# Patient Record
Sex: Male | Born: 1985 | Race: White | Hispanic: No | Marital: Single | Smoking: Never smoker
Health system: Southern US, Community
[De-identification: ages and names within clinical notes are randomized; demographics above are authoritative.]

## PROBLEM LIST (undated history)

## (undated) DIAGNOSIS — F32A Depression, unspecified: Secondary | ICD-10-CM

## (undated) DIAGNOSIS — F419 Anxiety disorder, unspecified: Secondary | ICD-10-CM

## (undated) DIAGNOSIS — F329 Major depressive disorder, single episode, unspecified: Secondary | ICD-10-CM

## (undated) HISTORY — PX: TONSILLECTOMY AND ADENOIDECTOMY: SHX28

## (undated) HISTORY — PX: TYMPANOSTOMY TUBE PLACEMENT: SHX32

## (undated) HISTORY — PX: WISDOM TOOTH EXTRACTION: SHX21

## (undated) HISTORY — PX: TONSILLECTOMY: SUR1361

---

## 2010-05-17 ENCOUNTER — Encounter: Admission: RE | Admit: 2010-05-17 | Discharge: 2010-05-17 | Payer: Self-pay | Admitting: Occupational Medicine

## 2012-07-27 ENCOUNTER — Emergency Department (HOSPITAL_COMMUNITY)
Admission: EM | Admit: 2012-07-27 | Discharge: 2012-07-27 | Disposition: A | Discharge status: Home / Self Care | Payer: Worker's Compensation | Attending: Emergency Medicine | Admitting: Emergency Medicine

## 2012-07-27 ENCOUNTER — Encounter (HOSPITAL_COMMUNITY): Payer: Self-pay | Admitting: *Deleted

## 2012-07-27 ENCOUNTER — Emergency Department (HOSPITAL_COMMUNITY): Payer: Worker's Compensation

## 2012-07-27 DIAGNOSIS — S0993XA Unspecified injury of face, initial encounter: Secondary | ICD-10-CM | POA: Insufficient documentation

## 2012-07-27 DIAGNOSIS — Y92410 Unspecified street and highway as the place of occurrence of the external cause: Secondary | ICD-10-CM | POA: Insufficient documentation

## 2012-07-27 DIAGNOSIS — S199XXA Unspecified injury of neck, initial encounter: Secondary | ICD-10-CM

## 2012-07-27 MED ORDER — IBUPROFEN 400 MG PO TABS
400.0000 mg | ORAL_TABLET | Freq: Once | ORAL | Status: AC
  Administered 2012-07-27: 400 mg via ORAL
  Filled 2012-07-27: qty 1
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
## 2012-07-27 NOTE — ED Notes (Signed)
Pt ambulating independently w/ steady gait on d/c in no acute distress, A&Ox4. D/c instructions reviewed w/ pt - pt denies any further questions or concerns at present.  

## 2013-04-18 ENCOUNTER — Other Ambulatory Visit: Payer: Self-pay | Admitting: Family Medicine

## 2013-04-18 DIAGNOSIS — R1909 Other intra-abdominal and pelvic swelling, mass and lump: Secondary | ICD-10-CM

## 2013-04-24 ENCOUNTER — Ambulatory Visit
Admission: RE | Admit: 2013-04-24 | Discharge: 2013-04-24 | Disposition: A | Discharge status: Home / Self Care | Payer: 59 | Source: Ambulatory Visit | Attending: Family Medicine | Admitting: Family Medicine

## 2013-04-24 DIAGNOSIS — R1909 Other intra-abdominal and pelvic swelling, mass and lump: Secondary | ICD-10-CM

## 2014-03-28 ENCOUNTER — Encounter (INDEPENDENT_AMBULATORY_CARE_PROVIDER_SITE_OTHER): Payer: Self-pay | Admitting: General Surgery

## 2014-03-28 ENCOUNTER — Ambulatory Visit (INDEPENDENT_AMBULATORY_CARE_PROVIDER_SITE_OTHER): Payer: 59 | Admitting: General Surgery

## 2014-03-28 VITALS — BP 118/70 | HR 81 | Temp 98.6°F | Resp 16 | Ht 73.0 in | Wt 199.6 lb

## 2014-03-28 DIAGNOSIS — K4090 Unilateral inguinal hernia, without obstruction or gangrene, not specified as recurrent: Secondary | ICD-10-CM

## 2014-03-28 NOTE — Progress Notes (Signed)
Patient ID: Julian Hall, male   DOB: 09-06-1986, 28 y.o.   MRN: [id number]  Chief Complaint  Patient presents with  . Incisional Hernia    HPI Julian Hall is a 28 y.o. male.  The patient is a 28 year old male is referred by Dr. Rex Kras for evaluation of a left inguinal hernia. The patient states in there for about a year and has become more discomforting. He does notice a bulge at times. 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  HPI  History reviewed. No pertinent past medical history.  Past Surgical History  Procedure Laterality Date  . Tonsillectomy and adenoidectomy    . Tympanostomy tube placement    . Wisdom tooth extraction      History reviewed. No pertinent family history.  Social History History  Substance Use Topics  . Smoking status: Never Smoker   . Smokeless tobacco: Not on file  . Alcohol Use: Yes     Comment: occasionally    No Known Allergies  Current Outpatient Prescriptions  Medication Sig Dispense Refill  . ALPRAZolam (XANAX) 0.5 MG tablet Take 0.5 mg by mouth at bedtime as needed for anxiety.      . sertraline (ZOLOFT) 100 MG tablet Take 100 mg by mouth daily.       No current facility-administered medications for this visit.    Review of Systems Review of Systems  Constitutional: Negative.   HENT: Negative.   Eyes: Negative.   Respiratory: Negative.   Cardiovascular: Negative.   Gastrointestinal: Negative.   Endocrine: Negative.   Neurological: Negative.     Blood pressure 118/70, pulse 81, temperature 98.6 F (37 C), resp. rate 16, height 6\' 1"  (1.854 m), weight 199 lb 9.6 oz (90.538 kg).  Physical Exam Physical Exam  Constitutional: He is oriented to person, place, and time. He appears well-developed and well-nourished.  HENT:  Head: Normocephalic and atraumatic.  Eyes: Conjunctivae and EOM are normal. Pupils are equal, round, and reactive to light.  Neck: Normal range of motion. Neck supple.   Cardiovascular: Normal rate, regular rhythm and normal heart sounds.   Pulmonary/Chest: Effort normal and breath sounds normal.  Abdominal: Soft. Bowel sounds are normal. He exhibits no distension and no mass. There is no tenderness. There is no rebound and no guarding. A hernia is present. Hernia confirmed positive in the left inguinal area. Hernia confirmed negative in the right inguinal area.  Musculoskeletal: Normal range of motion.  Neurological: He is alert and oriented to person, place, and time.  Skin: Skin is warm and dry.    Data Reviewed none  Assessment    28 year old male with a left inguinal hernia     Plan    1. The patient like to proceed to the operating room for laparoscopic left and hernia repair with mesh. 2.All risks and benefits were discussed with the patient, to generally include infection, bleeding, damage to surrounding structures, acute and chronic nerve pain, and recurrence. Alternatives were offered and described.  All questions were answered and the patient voiced understanding of the procedure and wishes to proceed at this point.         Rosario Jacks., Katalena Malveaux 03/28/2014, 3:34 PM

## 2014-04-16 ENCOUNTER — Encounter (HOSPITAL_COMMUNITY): Payer: Self-pay | Admitting: Pharmacy Technician

## 2014-04-17 ENCOUNTER — Other Ambulatory Visit (INDEPENDENT_AMBULATORY_CARE_PROVIDER_SITE_OTHER): Payer: Self-pay | Admitting: General Surgery

## 2014-04-17 ENCOUNTER — Encounter (HOSPITAL_COMMUNITY): Payer: Self-pay

## 2014-04-17 ENCOUNTER — Encounter (HOSPITAL_COMMUNITY)
Admission: RE | Admit: 2014-04-17 | Discharge: 2014-04-17 | Disposition: A | Discharge status: Home / Self Care | Payer: 59 | Source: Ambulatory Visit | Attending: General Surgery | Admitting: General Surgery

## 2014-04-17 DIAGNOSIS — Z01812 Encounter for preprocedural laboratory examination: Secondary | ICD-10-CM | POA: Insufficient documentation

## 2014-04-17 HISTORY — DX: Anxiety disorder, unspecified: F41.9

## 2014-04-17 HISTORY — DX: Major depressive disorder, single episode, unspecified: F32.9

## 2014-04-17 HISTORY — DX: Depression, unspecified: F32.A

## 2014-04-17 LAB — CBC
HEMATOCRIT: 43.1 % (ref 39.0–52.0)
HEMOGLOBIN: 14.6 g/dL (ref 13.0–17.0)
MCH: 28.6 pg (ref 26.0–34.0)
MCHC: 33.9 g/dL (ref 30.0–36.0)
MCV: 84.3 fL (ref 78.0–100.0)
Platelets: 232 10*3/uL (ref 150–400)
RBC: 5.11 MIL/uL (ref 4.22–5.81)
RDW: 12.6 % (ref 11.5–15.5)
WBC: 5.6 10*3/uL (ref 4.0–10.5)

## 2014-04-17 LAB — BASIC METABOLIC PANEL
ANION GAP: 18 — AB (ref 5–15)
BUN: 20 mg/dL (ref 6–23)
CALCIUM: 9.7 mg/dL (ref 8.4–10.5)
CO2: 22 meq/L (ref 19–32)
Chloride: 101 mEq/L (ref 96–112)
Creatinine, Ser: 0.88 mg/dL (ref 0.50–1.35)
GFR calc Af Amer: >90 mL/min (ref >90)
Glucose, Bld: 92 mg/dL (ref 70–99)
Potassium: 3.8 mEq/L (ref 3.7–5.3)
Sodium: 141 mEq/L (ref 137–147)

## 2014-04-17 NOTE — Pre-Procedure Instructions (Signed)
Julian Hall  04/17/2014   Your procedure is scheduled on:  July 6th, Monday   Report to [facility] Admitting at 9:00 AM.   Call this number if you have problems the morning of surgery: [phone]   Remember:   Do not eat food or drink liquids after midnight Sunday.    Take these medicines the morning of surgery with A SIP OF WATER: XANAX   Do not wear jewelry - no rings or watches.  Do not wear lotions or colognes.  You may NOT wear deodorant.   Men may shave face and neck.   Do not bring valuables to the hospital.  [facility] is not responsible for any belongings or valuables.               Contacts, dentures or bridgework may not be worn into surgery.  Leave suitcase in the car. After surgery it may be brought to your room.  For patients admitted to the hospital, discharge time is determined by your                treatment team.               Patients discharged the day of surgery will not be allowed to drive home.   Name and phone number of your driver: /w parent    Special Instructions: Special Instructions: [facility] - Preparing for Surgery  Before surgery, you can play an important role.  Because skin is not sterile, your skin needs to be as free of germs as possible.  You can reduce the number of germs on you skin by washing with CHG (chlorahexidine gluconate) soap before surgery.  CHG is an antiseptic cleaner which kills germs and bonds with the skin to continue killing germs even after washing.  Please DO NOT use if you have an allergy to CHG or antibacterial soaps.  If your skin becomes reddened/irritated stop using the CHG and inform your nurse when you arrive at Short Stay.  Do not shave (including legs and underarms) for at least 48 hours prior to the first CHG shower.  You may shave your face.  Please follow these instructions carefully:   1.  Shower with CHG Soap the night before surgery and the  morning of Surgery.  2.  If you choose to wash  your hair, wash your hair first as usual with your  normal shampoo.  3.  After you shampoo, rinse your hair and body thoroughly to remove the  Shampoo.  4.  Use CHG as you would any other liquid soap.  You can apply chg directly to the skin and wash gently with scrungie or a clean washcloth.  5.  Apply the CHG Soap to your body ONLY FROM THE NECK DOWN.    Do not use on open wounds or open sores.  Avoid contact with your eyes, ears, mouth and genitals (private parts).  Wash genitals (private parts)   with your normal soap.  6.  Wash thoroughly, paying special attention to the area where your surgery will be performed.  7.  Thoroughly rinse your body with warm water from the neck down.  8.  DO NOT shower/wash with your normal soap after using and rinsing off   the CHG Soap.  9.  Pat yourself dry with a clean towel.            10 .  Wear clean pajamas.  11.  Place clean sheets on your bed the night of your first shower and do not sleep with pets.  Day of Surgery  Do not apply any lotions/deodorants the morning of surgery.  Please wear clean clothes to the hospital/surgery center.   Please read over the following fact sheets that you were given: Pain Booklet, Coughing and Deep Breathing and Surgical Site Infection Prevention

## 2014-04-17 NOTE — Progress Notes (Signed)
Call to Dr. Johney Frame office, spoke with Lavella Lemons, asked that orders be placed by Dr. Rosendo Gros.

## 2014-04-20 MED ORDER — CEFAZOLIN SODIUM-DEXTROSE 2-3 GM-% IV SOLR
2.0000 g | INTRAVENOUS | Status: AC
  Administered 2014-04-21: 2 g via INTRAVENOUS

## 2014-04-21 ENCOUNTER — Encounter (HOSPITAL_COMMUNITY): Payer: Self-pay | Admitting: Anesthesiology

## 2014-04-21 ENCOUNTER — Telehealth (INDEPENDENT_AMBULATORY_CARE_PROVIDER_SITE_OTHER): Payer: Self-pay | Admitting: General Surgery

## 2014-04-21 ENCOUNTER — Encounter (INDEPENDENT_AMBULATORY_CARE_PROVIDER_SITE_OTHER): Payer: Self-pay | Admitting: General Surgery

## 2014-04-21 ENCOUNTER — Encounter (HOSPITAL_COMMUNITY)
Admission: RE | Disposition: A | Discharge status: Home / Self Care | Payer: Self-pay | Source: Ambulatory Visit | Attending: General Surgery

## 2014-04-21 ENCOUNTER — Encounter (HOSPITAL_COMMUNITY): Payer: 59 | Admitting: Anesthesiology

## 2014-04-21 ENCOUNTER — Ambulatory Visit (HOSPITAL_COMMUNITY)
Admission: RE | Admit: 2014-04-21 | Discharge: 2014-04-21 | Disposition: A | Discharge status: Home / Self Care | Payer: 59 | Source: Ambulatory Visit | Attending: General Surgery | Admitting: General Surgery

## 2014-04-21 ENCOUNTER — Ambulatory Visit (HOSPITAL_COMMUNITY): Payer: 59 | Admitting: Anesthesiology

## 2014-04-21 DIAGNOSIS — F411 Generalized anxiety disorder: Secondary | ICD-10-CM | POA: Insufficient documentation

## 2014-04-21 DIAGNOSIS — K4090 Unilateral inguinal hernia, without obstruction or gangrene, not specified as recurrent: Secondary | ICD-10-CM

## 2014-04-21 HISTORY — PX: HERNIA REPAIR: SHX51

## 2014-04-21 HISTORY — PX: INGUINAL HERNIA REPAIR: SHX194

## 2014-04-21 HISTORY — PX: INSERTION OF MESH: SHX5868

## 2014-04-21 SURGERY — REPAIR, HERNIA, INGUINAL, LAPAROSCOPIC
Anesthesia: General | Site: Groin | Laterality: Left

## 2014-04-21 MED ORDER — LIDOCAINE HCL (CARDIAC) 20 MG/ML IV SOLN
INTRAVENOUS | Status: AC
Start: 2014-04-21 — End: 2014-04-21
  Filled 2014-04-21: qty 5

## 2014-04-21 MED ORDER — BUPIVACAINE HCL 0.25 % IJ SOLN
INTRAMUSCULAR | Status: DC | PRN
  Administered 2014-04-21: 7 mL

## 2014-04-21 MED ORDER — ROCURONIUM BROMIDE 50 MG/5ML IV SOLN
INTRAVENOUS | Status: AC
  Filled 2014-04-21: qty 1

## 2014-04-21 MED ORDER — LACTATED RINGERS IV SOLN
INTRAVENOUS | Status: DC
  Administered 2014-04-21: 11:00:00 via INTRAVENOUS

## 2014-04-21 MED ORDER — LIDOCAINE HCL (CARDIAC) 20 MG/ML IV SOLN
INTRAVENOUS | Status: AC
  Filled 2014-04-21: qty 5

## 2014-04-21 MED ORDER — OXYCODONE HCL 5 MG PO TABS
ORAL_TABLET | ORAL | Status: AC
  Administered 2014-04-21: 10 mg via ORAL
  Filled 2014-04-21: qty 2

## 2014-04-21 MED ORDER — OXYCODONE HCL 5 MG PO TABS
5.0000 mg | ORAL_TABLET | ORAL | Status: DC | PRN
  Administered 2014-04-21: 10 mg via ORAL

## 2014-04-21 MED ORDER — ACETAMINOPHEN 325 MG PO TABS
650.0000 mg | ORAL_TABLET | ORAL | Status: DC | PRN
  Filled 2014-04-21: qty 2

## 2014-04-21 MED ORDER — NEOSTIGMINE METHYLSULFATE 10 MG/10ML IV SOLN
INTRAVENOUS | Status: AC
  Filled 2014-04-21: qty 1

## 2014-04-21 MED ORDER — OXYCODONE-ACETAMINOPHEN 5-325 MG PO TABS
ORAL_TABLET | ORAL | Status: AC
  Filled 2014-04-21: qty 2

## 2014-04-21 MED ORDER — SODIUM CHLORIDE 0.9 % IJ SOLN: 3.0000 mL | INTRAMUSCULAR | Status: DC | PRN

## 2014-04-21 MED ORDER — ONDANSETRON HCL 4 MG/2ML IJ SOLN
INTRAMUSCULAR | Status: AC
  Filled 2014-04-21: qty 2

## 2014-04-21 MED ORDER — FENTANYL CITRATE 0.05 MG/ML IJ SOLN
INTRAMUSCULAR | Status: DC | PRN
  Administered 2014-04-21: 50 ug via INTRAVENOUS
  Administered 2014-04-21: 100 ug via INTRAVENOUS

## 2014-04-21 MED ORDER — ROCURONIUM BROMIDE 100 MG/10ML IV SOLN
INTRAVENOUS | Status: DC | PRN
  Administered 2014-04-21: 40 mg via INTRAVENOUS
  Administered 2014-04-21: 10 mg via INTRAVENOUS

## 2014-04-21 MED ORDER — BUPIVACAINE HCL (PF) 0.25 % IJ SOLN
INTRAMUSCULAR | Status: AC
  Filled 2014-04-21: qty 30

## 2014-04-21 MED ORDER — HYDROMORPHONE HCL PF 1 MG/ML IJ SOLN
INTRAMUSCULAR | Status: AC
  Administered 2014-04-21: 0.5 mg via INTRAVENOUS
  Filled 2014-04-21: qty 1

## 2014-04-21 MED ORDER — SCOPOLAMINE 1 MG/3DAYS TD PT72
MEDICATED_PATCH | TRANSDERMAL | Status: AC
  Filled 2014-04-21: qty 1

## 2014-04-21 MED ORDER — 0.9 % SODIUM CHLORIDE (POUR BTL) OPTIME
TOPICAL | Status: DC | PRN
  Administered 2014-04-21: 1000 mL

## 2014-04-21 MED ORDER — PROPOFOL 10 MG/ML IV BOLUS
INTRAVENOUS | Status: AC
  Filled 2014-04-21: qty 20

## 2014-04-21 MED ORDER — ONDANSETRON HCL 4 MG/2ML IJ SOLN: 4.0000 mg | Freq: Once | INTRAMUSCULAR | Status: DC | PRN

## 2014-04-21 MED ORDER — NEOSTIGMINE METHYLSULFATE 10 MG/10ML IV SOLN
INTRAVENOUS | Status: DC | PRN
  Administered 2014-04-21: 4 mg via INTRAVENOUS

## 2014-04-21 MED ORDER — LIDOCAINE HCL 4 % MT SOLN
OROMUCOSAL | Status: DC | PRN
  Administered 2014-04-21: 4 mL via TOPICAL

## 2014-04-21 MED ORDER — SCOPOLAMINE 1 MG/3DAYS TD PT72
1.0000 | MEDICATED_PATCH | TRANSDERMAL | Status: DC
  Administered 2014-04-21: 1.5 mg via TRANSDERMAL

## 2014-04-21 MED ORDER — ARTIFICIAL TEARS OP OINT
TOPICAL_OINTMENT | OPHTHALMIC | Status: DC | PRN
  Administered 2014-04-21: 1 via OPHTHALMIC

## 2014-04-21 MED ORDER — SODIUM CHLORIDE 0.9 % IV SOLN: 250.0000 mL | INTRAVENOUS | Status: DC | PRN

## 2014-04-21 MED ORDER — GLYCOPYRROLATE 0.2 MG/ML IJ SOLN
INTRAMUSCULAR | Status: AC
  Filled 2014-04-21: qty 4

## 2014-04-21 MED ORDER — OXYCODONE-ACETAMINOPHEN 5-325 MG PO TABS
2.0000 | ORAL_TABLET | Freq: Once | ORAL | Status: AC
  Administered 2014-04-21: 2 via ORAL

## 2014-04-21 MED ORDER — SUCCINYLCHOLINE CHLORIDE 20 MG/ML IJ SOLN
INTRAMUSCULAR | Status: AC
  Filled 2014-04-21: qty 1

## 2014-04-21 MED ORDER — FENTANYL CITRATE 0.05 MG/ML IJ SOLN
INTRAMUSCULAR | Status: AC
  Filled 2014-04-21: qty 5

## 2014-04-21 MED ORDER — ACETAMINOPHEN 650 MG RE SUPP
650.0000 mg | RECTAL | Status: DC | PRN
  Filled 2014-04-21: qty 1

## 2014-04-21 MED ORDER — MIDAZOLAM HCL 2 MG/2ML IJ SOLN
INTRAMUSCULAR | Status: AC
  Filled 2014-04-21: qty 2

## 2014-04-21 MED ORDER — PROPOFOL 10 MG/ML IV BOLUS
INTRAVENOUS | Status: DC | PRN
  Administered 2014-04-21: 200 mg via INTRAVENOUS

## 2014-04-21 MED ORDER — OXYCODONE-ACETAMINOPHEN 7.5-325 MG PO TABS: 1.0000 | ORAL_TABLET | ORAL | Status: AC | PRN

## 2014-04-21 MED ORDER — LIDOCAINE HCL (CARDIAC) 20 MG/ML IV SOLN
INTRAVENOUS | Status: DC | PRN
  Administered 2014-04-21: 100 mg via INTRAVENOUS

## 2014-04-21 MED ORDER — ONDANSETRON HCL 4 MG/2ML IJ SOLN
INTRAMUSCULAR | Status: DC | PRN
  Administered 2014-04-21: 4 mg via INTRAVENOUS

## 2014-04-21 MED ORDER — GLYCOPYRROLATE 0.2 MG/ML IJ SOLN
INTRAMUSCULAR | Status: DC | PRN
  Administered 2014-04-21: .8 mg via INTRAVENOUS

## 2014-04-21 MED ORDER — HYDROMORPHONE HCL PF 1 MG/ML IJ SOLN
0.2500 mg | INTRAMUSCULAR | Status: DC | PRN
  Administered 2014-04-21 (×4): 0.5 mg via INTRAVENOUS

## 2014-04-21 MED ORDER — ARTIFICIAL TEARS OP OINT
TOPICAL_OINTMENT | OPHTHALMIC | Status: AC
  Filled 2014-04-21: qty 3.5

## 2014-04-21 MED ORDER — MIDAZOLAM HCL 5 MG/5ML IJ SOLN
INTRAMUSCULAR | Status: DC | PRN
  Administered 2014-04-21: 2 mg via INTRAVENOUS

## 2014-04-21 MED ORDER — CHLORHEXIDINE GLUCONATE 4 % EX LIQD: 1.0000 "application " | Freq: Once | CUTANEOUS | Status: DC

## 2014-04-21 MED ORDER — SODIUM CHLORIDE 0.9 % IJ SOLN: 3.0000 mL | Freq: Two times a day (BID) | INTRAMUSCULAR | Status: DC

## 2014-04-21 SURGICAL SUPPLY — 55 items
APL SKNCLS STERI-STRIP NONHPOA (GAUZE/BANDAGES/DRESSINGS) ×1
APPLIER CLIP 5 13 M/L LIGAMAX5 (MISCELLANEOUS)
APR CLP MED LRG 5 ANG JAW (MISCELLANEOUS)
BENZOIN TINCTURE PRP APPL 2/3 (GAUZE/BANDAGES/DRESSINGS) ×3 IMPLANT
CANISTER SUCTION 2500CC (MISCELLANEOUS) IMPLANT
CHLORAPREP W/TINT 26ML (MISCELLANEOUS) ×3 IMPLANT
CLIP APPLIE 5 13 M/L LIGAMAX5 (MISCELLANEOUS) IMPLANT
CLOSURE WOUND 1/2 X4 (GAUZE/BANDAGES/DRESSINGS) ×1
COVER SURGICAL LIGHT HANDLE (MISCELLANEOUS) ×3 IMPLANT
DISSECT BALLN SPACEMKR + OVL (BALLOONS)
DISSECTOR BALLN SPACEMKR + OVL (BALLOONS) IMPLANT
DISSECTOR BLUNT TIP ENDO 5MM (MISCELLANEOUS) IMPLANT
DRAPE UTILITY 15X26 W/TAPE STR (DRAPE) ×6 IMPLANT
ELECT REM PT RETURN 9FT ADLT (ELECTROSURGICAL) ×3
ELECTRODE REM PT RTRN 9FT ADLT (ELECTROSURGICAL) ×1 IMPLANT
GAUZE SPONGE 2X2 8PLY STRL LF (GAUZE/BANDAGES/DRESSINGS) ×1 IMPLANT
GLOVE BIO SURGEON STRL SZ7 (GLOVE) ×2 IMPLANT
GLOVE BIO SURGEON STRL SZ7.5 (GLOVE) ×3 IMPLANT
GLOVE BIOGEL PI IND STRL 6.5 (GLOVE) IMPLANT
GLOVE BIOGEL PI IND STRL 7.0 (GLOVE) IMPLANT
GLOVE BIOGEL PI INDICATOR 6.5 (GLOVE) ×2
GLOVE BIOGEL PI INDICATOR 7.0 (GLOVE) ×4
GLOVE SURG SS PI 7.0 STRL IVOR (GLOVE) ×2 IMPLANT
GOWN STRL REUS W/ TWL LRG LVL3 (GOWN DISPOSABLE) ×2 IMPLANT
GOWN STRL REUS W/ TWL XL LVL3 (GOWN DISPOSABLE) ×1 IMPLANT
GOWN STRL REUS W/TWL LRG LVL3 (GOWN DISPOSABLE) ×6
GOWN STRL REUS W/TWL XL LVL3 (GOWN DISPOSABLE) ×3
KIT BASIN OR (CUSTOM PROCEDURE TRAY) ×3 IMPLANT
KIT ROOM TURNOVER OR (KITS) ×3 IMPLANT
MESH 3DMAX 4X6 LT LRG (Mesh General) ×2 IMPLANT
NDL INSUFFLATION 14GA 120MM (NEEDLE) ×1 IMPLANT
NEEDLE INSUFFLATION 14GA 120MM (NEEDLE) ×3 IMPLANT
NS IRRIG 1000ML POUR BTL (IV SOLUTION) ×3 IMPLANT
PAD ARMBOARD 7.5X6 YLW CONV (MISCELLANEOUS) ×6 IMPLANT
RELOAD STAPLE 4.0 BLU F/HERNIA (INSTRUMENTS) ×1 IMPLANT
RELOAD STAPLE 4.8 BLK F/HERNIA (STAPLE) IMPLANT
RELOAD STAPLE HERNIA 4.0 BLUE (INSTRUMENTS) ×3 IMPLANT
RELOAD STAPLE HERNIA 4.8 BLK (STAPLE) IMPLANT
SCISSORS LAP 5X35 DISP (ENDOMECHANICALS) ×3 IMPLANT
SET IRRIG TUBING LAPAROSCOPIC (IRRIGATION / IRRIGATOR) IMPLANT
SET TROCAR LAP APPLE-HUNT 5MM (ENDOMECHANICALS) ×3 IMPLANT
SPONGE GAUZE 2X2 STER 10/PKG (GAUZE/BANDAGES/DRESSINGS) ×2
STAPLER HERNIA 12 8.5 360D (INSTRUMENTS) ×3 IMPLANT
STRIP CLOSURE SKIN 1/2X4 (GAUZE/BANDAGES/DRESSINGS) ×1 IMPLANT
SUT MNCRL AB 4-0 PS2 18 (SUTURE) ×3 IMPLANT
SUT VIC AB 1 CT1 27 (SUTURE)
SUT VIC AB 1 CT1 27XBRD ANBCTR (SUTURE) IMPLANT
SYR CONTROL 10ML LL (SYRINGE) ×2 IMPLANT
TAPE CLOTH SURG 4X10 WHT LF (GAUZE/BANDAGES/DRESSINGS) ×2 IMPLANT
TOWEL OR 17X24 6PK STRL BLUE (TOWEL DISPOSABLE) ×3 IMPLANT
TOWEL OR 17X26 10 PK STRL BLUE (TOWEL DISPOSABLE) ×3 IMPLANT
TRAY FOLEY CATH 16FR SILVER (SET/KITS/TRAYS/PACK) ×3 IMPLANT
TRAY LAPAROSCOPIC (CUSTOM PROCEDURE TRAY) ×3 IMPLANT
TROCAR XCEL 12X100 BLDLESS (ENDOMECHANICALS) ×2 IMPLANT
WATER STERILE IRR 1000ML POUR (IV SOLUTION) ×2 IMPLANT

## 2014-04-21 NOTE — Transfer of Care (Signed)
Immediate Anesthesia Transfer of Care Note  Patient: Julian Hall  Procedure(s) Performed: Procedure(s): LAPAROSCOPIC LEFT INGUINAL HERNIA REPAIR (Left) INSERTION OF MESH (Left)  Patient Location: PACU  Anesthesia Type:General  Level of Consciousness: awake  Airway & Oxygen Therapy: Patient Spontanous Breathing and Patient connected to nasal cannula oxygen  Post-op Assessment: Report given to PACU RN  Post vital signs: Reviewed and stable  Complications: No apparent anesthesia complications

## 2014-04-21 NOTE — H&P (View-Only) (Signed)
Patient ID: Julian Hall, male   DOB: July 19, 1986, 28 y.o.   MRN: [id number]  Chief Complaint  Patient presents with  . Incisional Hernia    HPI MITCHAEL LUCKEY is a 28 y.o. male.  The patient is a 28 year old male is referred by Dr. Rex Kras for evaluation of a left inguinal hernia. The patient states in there for about a year and has become more discomforting. He does notice a bulge at times. 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  HPI  History reviewed. No pertinent past medical history.  Past Surgical History  Procedure Laterality Date  . Tonsillectomy and adenoidectomy    . Tympanostomy tube placement    . Wisdom tooth extraction      History reviewed. No pertinent family history.  Social History History  Substance Use Topics  . Smoking status: Never Smoker   . Smokeless tobacco: Not on file  . Alcohol Use: Yes     Comment: occasionally    No Known Allergies  Current Outpatient Prescriptions  Medication Sig Dispense Refill  . ALPRAZolam (XANAX) 0.5 MG tablet Take 0.5 mg by mouth at bedtime as needed for anxiety.      . sertraline (ZOLOFT) 100 MG tablet Take 100 mg by mouth daily.       No current facility-administered medications for this visit.    Review of Systems Review of Systems  Constitutional: Negative.   HENT: Negative.   Eyes: Negative.   Respiratory: Negative.   Cardiovascular: Negative.   Gastrointestinal: Negative.   Endocrine: Negative.   Neurological: Negative.     Blood pressure 118/70, pulse 81, temperature 98.6 F (37 C), resp. rate 16, height 6\' 1"  (1.854 m), weight 199 lb 9.6 oz (90.538 kg).  Physical Exam Physical Exam  Constitutional: He is oriented to person, place, and time. He appears well-developed and well-nourished.  HENT:  Head: Normocephalic and atraumatic.  Eyes: Conjunctivae and EOM are normal. Pupils are equal, round, and reactive to light.  Neck: Normal range of motion. Neck supple.   Cardiovascular: Normal rate, regular rhythm and normal heart sounds.   Pulmonary/Chest: Effort normal and breath sounds normal.  Abdominal: Soft. Bowel sounds are normal. He exhibits no distension and no mass. There is no tenderness. There is no rebound and no guarding. A hernia is present. Hernia confirmed positive in the left inguinal area. Hernia confirmed negative in the right inguinal area.  Musculoskeletal: Normal range of motion.  Neurological: He is alert and oriented to person, place, and time.  Skin: Skin is warm and dry.    Data Reviewed none  Assessment    28 year old male with a left inguinal hernia     Plan    1. The patient like to proceed to the operating room for laparoscopic left and hernia repair with mesh. 2.All risks and benefits were discussed with the patient, to generally include infection, bleeding, damage to surrounding structures, acute and chronic nerve pain, and recurrence. Alternatives were offered and described.  All questions were answered and the patient voiced understanding of the procedure and wishes to proceed at this point.         Rosario Jacks., Chrissy Ealey 03/28/2014, 3:34 PM

## 2014-04-21 NOTE — Discharge Instructions (Signed)
[facility] _______[facility]  UMBILICAL OR INGUINAL HERNIA REPAIR: POST OP INSTRUCTIONS  Always review your discharge instruction sheet given to you by the facility where your surgery was performed. IF YOU HAVE DISABILITY OR FAMILY LEAVE FORMS, YOU MUST BRING THEM TO THE OFFICE FOR PROCESSING.   DO NOT GIVE THEM TO YOUR DOCTOR.  1. A  prescription for pain medication may be given to you upon discharge.  Take your pain medication as prescribed, if needed.  If narcotic pain medicine is not needed, then you may take acetaminophen (Tylenol) or ibuprofen (Advil) as needed. 2. Take your usually prescribed medications unless otherwise directed. 3. If you need a refill on your pain medication, please contact your pharmacy.  They will contact our office to request authorization. Prescriptions will not be filled after 5 pm or on week-ends. 4. You should follow a light diet the first 24 hours after arrival home, such as soup and crackers, etc.  Be sure to include lots of fluids daily.  Resume your normal diet the day after surgery. 5. Most patients will experience some swelling and bruising around the umbilicus or in the groin and scrotum.  Ice packs and reclining will help.  Swelling and bruising can take several days to resolve.  6. It is common to experience some constipation if taking pain medication after surgery.  Increasing fluid intake and taking a stool softener (such as Colace) will usually help or prevent this problem from occurring.  A mild laxative (Milk of Magnesia or Miralax) should be taken according to package directions if there are no bowel movements after 48 hours. 7. Unless discharge instructions indicate otherwise, you may remove your bandages 24-48 hours after surgery, and you may shower at that time.  You may have steri-strips (small skin tapes) in place directly over the incision.  These strips should be left on the skin for 7-10 days.  If your surgeon used skin glue on the  incision, you may shower in 24 hours.  The glue will flake off over the next 2-3 weeks.  Any sutures or staples will be removed at the office during your follow-up visit. 8. ACTIVITIES:  You may resume regular (light) daily activities beginning the next day--such as daily self-care, walking, climbing stairs--gradually increasing activities as tolerated.  You may have sexual intercourse when it is comfortable.  Refrain from any heavy lifting or straining until approved by your doctor. a. You may drive when you are no longer taking prescription pain medication, you can comfortably wear a seatbelt, and you can safely maneuver your car and apply brakes. b. RETURN TO WORK:  __________________________________________________________ 9. You should see your doctor in the office for a follow-up appointment approximately 2-3 weeks after your surgery.  Make sure that you call for this appointment within a day or two after you arrive home to insure a convenient appointment time. 10. OTHER INSTRUCTIONS:  __________________________________________________________________________________________________________________________________________________________________________________________  WHEN TO CALL YOUR DOCTOR: 1. Fever over 101.0 2. Inability to urinate 3. Nausea and/or vomiting 4. Extreme swelling or bruising 5. Continued bleeding from incision. 6. Increased pain, redness, or drainage from the incision  The clinic staff is available to answer your questions during regular business hours.  Please don't hesitate to call and ask to speak to one of the nurses for clinical concerns.  If you have a medical emergency, go to the nearest emergency room or call 911.  A surgeon from [facility] is always on call at the hospital     [address], [address], [place], [place]  [address] ?  [address] [address], , [place]   [address] [phone] ? [phone] ? FAX [phone] Web site:  [email or web address]  

## 2014-04-21 NOTE — Op Note (Signed)
04/21/2014  2:11 PM  PATIENT:  Julian Hall  28 y.o. male  PRE-OPERATIVE DIAGNOSIS:  LEFT INGUINAL HERNIA  POST-OPERATIVE DIAGNOSIS: LARGE LEFT INDIRECT INGUINAL HERNIA  PROCEDURE:  Procedure(s): LAPAROSCOPIC LEFT INGUINAL HERNIA REPAIR (Left) INSERTION OF MESH (Left)  SURGEON:  Surgeon(s) and Role:    * Ralene Ok, MD - Primary  PHYSICIAN ASSISTANT:   ASSISTANTS: none   ANESTHESIA:   local and general  EBL:  Total I/O In: -  Out: 200 [Urine:200]  BLOOD ADMINISTERED:none  DRAINS: none   LOCAL MEDICATIONS USED:  BUPIVICAINE   SPECIMEN:  No Specimen  DISPOSITION OF SPECIMEN:  N/A  COUNTS:  YES  TOURNIQUET:  * No tourniquets in log *  DICTATION: .Dragon Dictation  Details of the procedure: The patient was taken back to the operating room. The patient was placed in supine position with bilateral SCDs in place. After appropriate anitbiotics were confirmed, a time-out was confirmed and all facts were verified.  0.25% Marcaine was used to infiltrate the umbilical area. He was used to cut down the skin and blunt dissection was used to get the anterior fashion.  The anterior fascia was incised approximately 1 cm and the muscles were divided anteriorly. Blunt dissection was then used to create a space in the preperitoneal area. At this time a 10 mm camera was then introduced into the space and advanced the pubic tubercle and a 12 mm trocar was placed over this and insufflation was started.  At this time and space was created from medial to laterally the preperitoneal space. The hernia sac was identified in the indirect space. Dissection of the hernia sac was undertaken the vas deferens was identified and protected in all parts of the case.  The hernia sac was large.  There was a small tear into the hernia sac. A Veress needle right upper quadrant to help evacuate the intraperitoneal air.  Once the hernia sac was taken down to approximately the umbilicus a Bard 3D  Max mesh was  introduced into the preperitoneal space.  The mesh was brought over the hernia space defect and anchored into place and secured to Cooper's ligament with 4.58mm staples from a Coviden hernia stapler. It was anchored to the anterior abdominal wall with 4.8 mm staples. The hernia sac was seen lying anterior to the mesh. There was no staples placed laterally. The insufflation was evacuated. The trochars were removed. The anterior fascia was reapproximated using #1 Vicryl on a UR- 6.  Intra-abdominal air was evacuated and the Veress needle removed. The skin was reapproximated using 4-0 Monocryl subcuticular fashion the patient was awakened from general anesthesia and taken to recovery in stable condition.   PLAN OF CARE: Discharge to home after PACU  PATIENT DISPOSITION:  PACU - hemodynamically stable.   Delay start of Pharmacological VTE agent (>24hrs) due to surgical blood loss or risk of bleeding: not applicable

## 2014-04-21 NOTE — Anesthesia Postprocedure Evaluation (Signed)
  Anesthesia Post-op Note  Patient: Julian Hall  Procedure(s) Performed: Procedure(s): LAPAROSCOPIC LEFT INGUINAL HERNIA REPAIR (Left) INSERTION OF MESH (Left)  Patient Location: PACU  Anesthesia Type:General  Level of Consciousness: awake, alert , oriented and patient cooperative  Airway and Oxygen Therapy: Patient Spontanous Breathing  Post-op Pain: mild  Post-op Assessment: Post-op Vital signs reviewed, Patient's Cardiovascular Status Stable, Respiratory Function Stable, Patent Airway, No signs of Nausea or vomiting and Pain level controlled  Post-op Vital Signs: stable  Last Vitals:  Filed Vitals:   04/21/14 1053  BP: 126/67  Pulse: 63  Temp: 36.7 C  Resp: 20    Complications: No apparent anesthesia complications

## 2014-04-21 NOTE — Telephone Encounter (Signed)
LMOM letting pt know that he has a RTW letter ready for pickup at our front desk.

## 2014-04-21 NOTE — Telephone Encounter (Signed)
Faxed RTW letter to employer number provided by patient.  Confirmation recieved

## 2014-04-21 NOTE — Interval H&P Note (Signed)
History and Physical Interval Note:  04/21/2014 12:25 PM  Julian Hall  has presented today for surgery, with the diagnosis of left inguinal hernia  The various methods of treatment have been discussed with the patient and family. After consideration of risks, benefits and other options for treatment, the patient has consented to  Procedure(s): LAPAROSCOPIC LEFT INGUINAL HERNIA REPAIR (Left) INSERTION OF MESH (Left) as a surgical intervention .  The patient's history has been reviewed, patient examined, no change in status, stable for surgery.  I have reviewed the patient's chart and labs.  Questions were answered to the patient's satisfaction.     Rosario Jacks., Anne Hahn

## 2014-04-21 NOTE — Anesthesia Preprocedure Evaluation (Addendum)
Anesthesia Evaluation  Patient identified by MRN, date of birth, ID band Patient awake    Reviewed: Allergy & Precautions, H&P , NPO status , Patient's Chart, lab work & pertinent test results  Airway Mallampati: II TM Distance: >3 FB Neck ROM: Full    Dental  (+) Teeth Intact, Dental Advisory Given   Pulmonary  breath sounds clear to auscultation        Cardiovascular Rhythm:Regular Rate:Normal     Neuro/Psych    GI/Hepatic   Endo/Other    Renal/GU      Musculoskeletal   Abdominal   Peds  Hematology   Anesthesia Other Findings   Reproductive/Obstetrics                          Anesthesia Physical Anesthesia Plan  ASA: I  Anesthesia Plan: General   Post-op Pain Management:    Induction: Intravenous  Airway Management Planned: Oral ETT  Additional Equipment:   Intra-op Plan:   Post-operative Plan: Extubation in OR  Informed Consent: I have reviewed the patients History and Physical, chart, labs and discussed the procedure including the risks, benefits and alternatives for the proposed anesthesia with the patient or authorized representative who has indicated his/her understanding and acceptance.     Plan Discussed with:   Anesthesia Plan Comments:         Anesthesia Quick Evaluation

## 2014-04-21 NOTE — Anesthesia Procedure Notes (Signed)
Procedure Name: Intubation Date/Time: 04/21/2014 12:41 PM Performed by: Barrington Ellison Pre-anesthesia Checklist: Patient identified, Emergency Drugs available, Suction available and Patient being monitored Patient Re-evaluated:Patient Re-evaluated prior to inductionOxygen Delivery Method: Circle system utilized Preoxygenation: Pre-oxygenation with 100% oxygen Intubation Type: IV induction Ventilation: Mask ventilation without difficulty Laryngoscope Size: Mac and 3 Grade View: Grade I Tube type: Oral Tube size: 7.5 mm Number of attempts: 1 Airway Equipment and Method: Stylet Placement Confirmation: ETT inserted through vocal cords under direct vision,  positive ETCO2 and breath sounds checked- equal and bilateral Secured at: 23 cm Tube secured with: Tape Dental Injury: Teeth and Oropharynx as per pre-operative assessment

## 2014-04-22 NOTE — Progress Notes (Signed)
Pt states drinking plenty of fluids . Frequent  trips to bathroom to empty bladder, not having full urine stream and small amt of urine each time no noted bladder discomfort. Pt instructed to measure urine next time and call MD office today and speaking with office nurse. Stated he would do this

## 2014-04-23 ENCOUNTER — Encounter (HOSPITAL_COMMUNITY): Payer: Self-pay | Admitting: General Surgery

## 2014-04-25 ENCOUNTER — Telehealth (INDEPENDENT_AMBULATORY_CARE_PROVIDER_SITE_OTHER): Payer: Self-pay

## 2014-04-25 NOTE — Telephone Encounter (Signed)
Called patients home and mobile numbers and left message to call our office RE:  Post op appointment.  Can be rescheduled to 05/01/14 in any open appointment time, okay per Dr. Rosendo Gros

## 2014-04-25 NOTE — Telephone Encounter (Signed)
Message copied by Ivor Costa on Fri Apr 25, 2014  4:01 PM ------      Message from: Joya San      Created: Tue Apr 22, 2014  2:00 PM       Pt is worried about the post op appt that is too far out from sx> I assured her that it was ok to go a week further out. ------

## 2014-05-01 ENCOUNTER — Encounter (INDEPENDENT_AMBULATORY_CARE_PROVIDER_SITE_OTHER): Payer: Self-pay | Admitting: General Surgery

## 2014-05-01 ENCOUNTER — Ambulatory Visit (INDEPENDENT_AMBULATORY_CARE_PROVIDER_SITE_OTHER): Payer: 59 | Admitting: General Surgery

## 2014-05-01 VITALS — BP 108/70 | HR 71 | Temp 97.0°F | Resp 16 | Ht 73.0 in | Wt 200.0 lb

## 2014-05-01 DIAGNOSIS — Z9889 Other specified postprocedural states: Secondary | ICD-10-CM

## 2014-05-01 NOTE — Progress Notes (Signed)
Patient ID: Julian Hall, male   DOB: Feb 14, 1986, 28 y.o.   MRN: [id number] Post op course The patient is a 28 year old male status post laparoscopic left inguinal hernia repair with mesh. Patient has been doing well postoperatively with minimal pain.  On Exam: Wounds are clean dry and intact, there is no hernia on palpation   Assessment and Plan 28 year old male status post laparoscopic left inguinal hernia repair with mesh 1. We discussed no heavy lifting for 6 weeks from the date of surgery. At that time the patient can continue with normal work duties 2. Patient followup as needed   Ralene Ok, MD [facility] General & Minimally Invasive Surgery Trauma & Emergency Surgery

## 2014-05-19 ENCOUNTER — Encounter (INDEPENDENT_AMBULATORY_CARE_PROVIDER_SITE_OTHER): Payer: 59 | Admitting: General Surgery

## 2014-05-22 ENCOUNTER — Encounter (INDEPENDENT_AMBULATORY_CARE_PROVIDER_SITE_OTHER): Payer: 59 | Admitting: General Surgery

## 2017-05-30 DIAGNOSIS — L237 Allergic contact dermatitis due to plants, except food: Secondary | ICD-10-CM | POA: Diagnosis not present

## 2017-07-31 DIAGNOSIS — M21961 Unspecified acquired deformity of right lower leg: Secondary | ICD-10-CM | POA: Diagnosis not present

## 2017-07-31 DIAGNOSIS — M21962 Unspecified acquired deformity of left lower leg: Secondary | ICD-10-CM | POA: Diagnosis not present

## 2017-07-31 DIAGNOSIS — D2371 Other benign neoplasm of skin of right lower limb, including hip: Secondary | ICD-10-CM | POA: Diagnosis not present

## 2017-09-04 DIAGNOSIS — G5761 Lesion of plantar nerve, right lower limb: Secondary | ICD-10-CM | POA: Diagnosis not present

## 2017-09-04 DIAGNOSIS — M79671 Pain in right foot: Secondary | ICD-10-CM | POA: Diagnosis not present

## 2018-02-22 DIAGNOSIS — Z13220 Encounter for screening for lipoid disorders: Secondary | ICD-10-CM | POA: Diagnosis not present

## 2018-02-22 DIAGNOSIS — L03115 Cellulitis of right lower limb: Secondary | ICD-10-CM | POA: Diagnosis not present

## 2018-02-22 DIAGNOSIS — Z79899 Other long term (current) drug therapy: Secondary | ICD-10-CM | POA: Diagnosis not present

## 2018-04-04 DIAGNOSIS — L309 Dermatitis, unspecified: Secondary | ICD-10-CM | POA: Diagnosis not present

## 2019-12-22 ENCOUNTER — Emergency Department (HOSPITAL_COMMUNITY): Payer: No Typology Code available for payment source

## 2019-12-22 ENCOUNTER — Other Ambulatory Visit: Payer: Self-pay

## 2019-12-22 ENCOUNTER — Encounter (HOSPITAL_COMMUNITY): Payer: Self-pay

## 2019-12-22 ENCOUNTER — Emergency Department (HOSPITAL_COMMUNITY)
Admission: EM | Admit: 2019-12-22 | Discharge: 2019-12-22 | Disposition: A | Discharge status: Home / Self Care | Payer: No Typology Code available for payment source | Attending: Emergency Medicine | Admitting: Emergency Medicine

## 2019-12-22 DIAGNOSIS — S6992XA Unspecified injury of left wrist, hand and finger(s), initial encounter: Secondary | ICD-10-CM | POA: Diagnosis not present

## 2019-12-22 DIAGNOSIS — S0081XA Abrasion of other part of head, initial encounter: Secondary | ICD-10-CM | POA: Insufficient documentation

## 2019-12-22 DIAGNOSIS — Z23 Encounter for immunization: Secondary | ICD-10-CM | POA: Diagnosis not present

## 2019-12-22 DIAGNOSIS — R519 Headache, unspecified: Secondary | ICD-10-CM | POA: Diagnosis not present

## 2019-12-22 DIAGNOSIS — Y929 Unspecified place or not applicable: Secondary | ICD-10-CM | POA: Diagnosis not present

## 2019-12-22 DIAGNOSIS — Y9389 Activity, other specified: Secondary | ICD-10-CM | POA: Diagnosis not present

## 2019-12-22 DIAGNOSIS — S62663A Nondisplaced fracture of distal phalanx of left middle finger, initial encounter for closed fracture: Secondary | ICD-10-CM | POA: Diagnosis not present

## 2019-12-22 DIAGNOSIS — S0990XA Unspecified injury of head, initial encounter: Secondary | ICD-10-CM | POA: Insufficient documentation

## 2019-12-22 DIAGNOSIS — Y990 Civilian activity done for income or pay: Secondary | ICD-10-CM | POA: Diagnosis not present

## 2019-12-22 MED ORDER — BACITRACIN ZINC 500 UNIT/GM EX OINT
TOPICAL_OINTMENT | Freq: Once | CUTANEOUS | Status: AC
  Administered 2019-12-22: 1 via TOPICAL
  Filled 2019-12-22: qty 0.9

## 2019-12-22 MED ORDER — LIDOCAINE 4 % EX CREA
TOPICAL_CREAM | Freq: Two times a day (BID) | CUTANEOUS | Status: DC | PRN
  Filled 2019-12-22: qty 5

## 2019-12-22 MED ORDER — LIDOCAINE 4 % EX CREA
TOPICAL_CREAM | Freq: Once | CUTANEOUS | Status: AC
  Administered 2019-12-22: 1 via TOPICAL
  Filled 2019-12-22: qty 5

## 2019-12-22 MED ORDER — ACETAMINOPHEN 325 MG PO TABS
650.0000 mg | ORAL_TABLET | Freq: Once | ORAL | Status: AC
Start: 2019-12-22 — End: 2019-12-22
  Administered 2019-12-22: 650 mg via ORAL
  Filled 2019-12-22: qty 2

## 2019-12-22 MED ORDER — BACITRACIN ZINC 500 UNIT/GM EX OINT: 1.0000 "application " | TOPICAL_OINTMENT | Freq: Two times a day (BID) | CUTANEOUS | 0 refills | Status: AC

## 2019-12-22 MED ORDER — TETANUS-DIPHTH-ACELL PERTUSSIS 5-2.5-18.5 LF-MCG/0.5 IM SUSP
0.5000 mL | Freq: Once | INTRAMUSCULAR | Status: AC
  Administered 2019-12-22: 0.5 mL via INTRAMUSCULAR
  Filled 2019-12-22: qty 0.5

## 2019-12-22 NOTE — Discharge Instructions (Signed)
We signed the ER for your injuries. The facial injuries will heal on their own over time. Apply the bacitracin ointment 2 times a day for the next 3 to 5 days. Apply the lidocaine only for severe pain.  Your finger has possible fracture.  You need to follow-up with your orthopedic doctor in 7 days for reassessment.  Keep the finger immobilized until then and read the instructions on finger fracture.
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
## 2019-12-22 NOTE — ED Triage Notes (Signed)
Patient presented to ed with c/o head injury, left middle finger, right side of the head laceration. Patient denies LOC, not on blood thinners. Patient is rating his pain 4/10 head pain.

## 2020-05-11 ENCOUNTER — Ambulatory Visit: Payer: 59 | Admitting: Dermatology

## 2020-12-22 ENCOUNTER — Other Ambulatory Visit: Payer: Self-pay

## 2020-12-22 ENCOUNTER — Ambulatory Visit
Admission: EM | Admit: 2020-12-22 | Discharge: 2020-12-22 | Disposition: A | Discharge status: Home / Self Care | Payer: 59 | Attending: Family Medicine | Admitting: Family Medicine

## 2020-12-22 DIAGNOSIS — H66002 Acute suppurative otitis media without spontaneous rupture of ear drum, left ear: Secondary | ICD-10-CM

## 2020-12-22 MED ORDER — AMOXICILLIN-POT CLAVULANATE 875-125 MG PO TABS: 1.0000 | ORAL_TABLET | Freq: Two times a day (BID) | ORAL | 0 refills | Status: AC

## 2020-12-22 NOTE — Discharge Instructions (Signed)
I have sent in Augmentin for you to take twice a day for 7 days.  Follow up with this office or with primary care if symptoms are persisting.  Follow up in the ER for high fever, trouble swallowing, trouble breathing, other concerning symptoms.

## 2020-12-22 NOTE — ED Triage Notes (Signed)
Pt presents with left ear pressure for past couple of days

## 2020-12-22 NOTE — ED Provider Notes (Signed)
[place]   [id number] 12/22/20 Arrival Time: 1416  CC: EAR PAIN  SUBJECTIVE: History from: patient.  Julian Hall is a 35 y.o. male who presents with Left ear pain pressure for the last 2 days. Reports hx multiple ear infections and history of tympanostomy tube placements. Denies a precipitating event, such as swimming or wearing ear plugs. Patient states the pain is constant and achy in character. Patient has not taken OTC medications for this. Symptoms are made worse with lying down. Denies fever, chills, fatigue, sinus pain, rhinorrhea, ear discharge, sore throat, SOB, wheezing, chest pain, nausea, changes in bowel or bladder habits.    ROS: As per HPI.  All other pertinent ROS negative.     Past Medical History:  Diagnosis Date  . Anxiety    social anxiety  . Depression    using Zoloft - for 9-10 yrs.    Past Surgical History:  Procedure Laterality Date  . HERNIA REPAIR Left 04/21/14  . INGUINAL HERNIA REPAIR Left 04/21/2014   Procedure: LAPAROSCOPIC LEFT INGUINAL HERNIA REPAIR;  Surgeon: Ralene Ok, MD;  Location: [place];  Service: General;  Laterality: Left;  . INSERTION OF MESH Left 04/21/2014   Procedure: INSERTION OF MESH;  Surgeon: Ralene Ok, MD;  Location: [place];  Service: General;  Laterality: Left;  . TONSILLECTOMY    . TONSILLECTOMY AND ADENOIDECTOMY    . TYMPANOSTOMY TUBE PLACEMENT    . WISDOM TOOTH EXTRACTION     No Known Allergies No current facility-administered medications on file prior to encounter.   Current Outpatient Medications on File Prior to Encounter  Medication Sig Dispense Refill  . ALPRAZolam (XANAX) 0.5 MG tablet Take 0.5 mg by mouth at bedtime as needed for anxiety.    Marland Kitchen atomoxetine (STRATTERA) 25 MG capsule Take 25 mg by mouth daily.     . bacitracin ointment Apply 1 application topically 2 (two) times daily. 14 g 0  . ibuprofen (ADVIL,MOTRIN) 200 MG tablet Take 200 mg by mouth every 6 (six) hours as needed.    . Melatonin  3 MG CAPS Take 1 capsule by mouth at bedtime.    Marland Kitchen oxyCODONE-acetaminophen (PERCOCET) 7.5-325 MG per tablet Take 1 tablet by mouth every 4 (four) hours as needed for pain. (Patient not taking: Reported on 12/22/2019) 30 tablet 0  . sertraline (ZOLOFT) 100 MG tablet Take 100 mg by mouth at bedtime.      Social History   Socioeconomic History  . Marital status: Single    Spouse name: Not on file  . Number of children: Not on file  . Years of education: Not on file  . Highest education level: Not on file  Occupational History  . Not on file  Tobacco Use  . Smoking status: Never Smoker  . Smokeless tobacco: Not on file  Substance and Sexual Activity  . Alcohol use: Yes    Comment: occasionally  . Drug use: No  . Sexual activity: Not on file  Other Topics Concern  . Not on file  Social History Narrative  . Not on file   Social Determinants of Health   Financial Resource Strain: Not on file  Food Insecurity: Not on file  Transportation Needs: Not on file  Physical Activity: Not on file  Stress: Not on file  Social Connections: Not on file  Intimate Partner Violence: Not on file   History reviewed. No pertinent family history.  OBJECTIVE:  There were no vitals filed for this visit.  General appearance: alert; appears fatigued HEENT: Ears: EACs clear, R TM  pearly gray with visible cone of light, without erythema, L TM erythematous, bulging, with effusion; Eyes: PERRL, EOMI grossly; Sinuses nontender to palpation; Nose: clear rhinorrhea; Throat: oropharynx mildly erythematous, tonsils 1+ without white tonsillar exudates, uvula midline Neck: supple without LAD Lungs: unlabored respirations, symmetrical air entry; cough: absent; no respiratory distress Heart: regular rate and rhythm.  Radial pulses 2+ symmetrical bilaterally Skin: warm and dry Psychological: alert and cooperative; normal mood and affect  Imaging: No results found.   ASSESSMENT & PLAN:  1. Non-recurrent  acute suppurative otitis media of left ear without spontaneous rupture of tympanic membrane     Meds ordered this encounter  Medications  . amoxicillin-clavulanate (AUGMENTIN) 875-125 MG tablet    Sig: Take 1 tablet by mouth 2 (two) times daily for 7 days.    Dispense:  14 tablet    Refill:  0    Order Specific Question:   Supervising Provider    Answer:   Chase Picket [[id number]]    Rest and drink plenty of fluids Prescribed augmentin 875 BID for 7 days Take medications as directed and to completion Continue to use OTC ibuprofen and/ or tylenol as needed for pain control Follow up with PCP if symptoms persists Return here or go to the ER if you have any new or worsening symptoms   Reviewed expectations re: course of current medical issues. Questions answered. Outlined signs and symptoms indicating need for more acute intervention. Patient verbalized understanding. After Visit Summary given.         Faustino Congress, NP 12/22/20 1521

## 2021-03-21 IMAGING — CT CT HEAD W/O CM
3 series · 15 of 47 positions shown, 18 images · non-contrast
Comparison: None.

CLINICAL DATA: Acute pain due to trauma.

EXAM:
CT HEAD WITHOUT CONTRAST
TECHNIQUE: Contiguous axial images were obtained from the base of the skull
through the vertex without intravenous contrast.

[Series 2: head wo · axial · 0.46mm/px · z∈[-180,-40]mm · 9 of 34 slices shown, 12 images]
[im 3/34  brain]
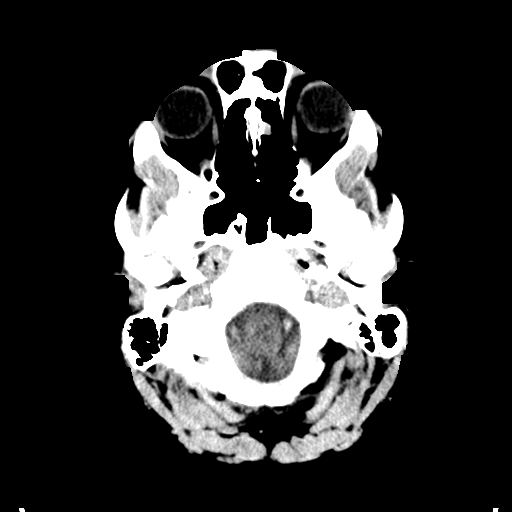
[im 3/34  bone]
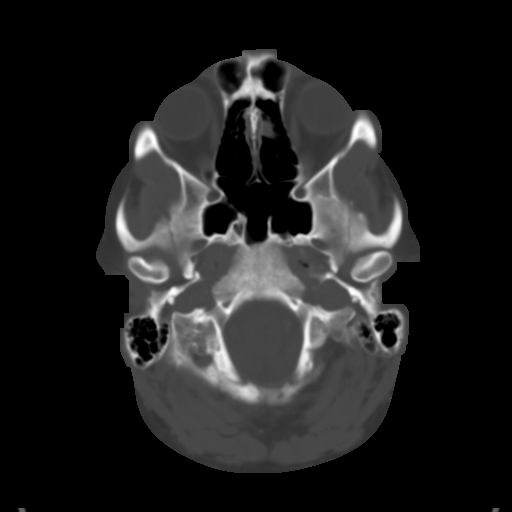
[im 6/34  brain]
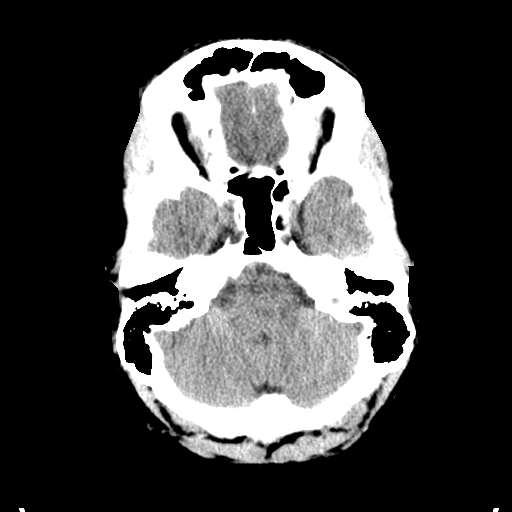
[im 10/34  brain]
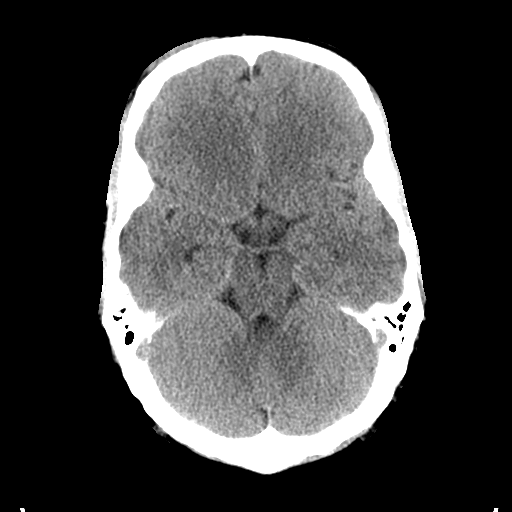
[im 13/34  brain]
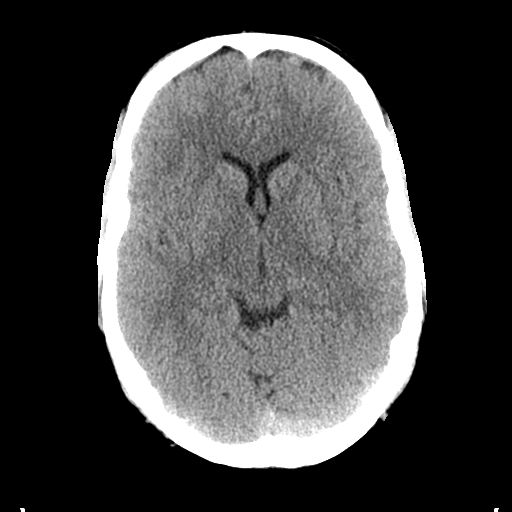
[im 18/34  brain]
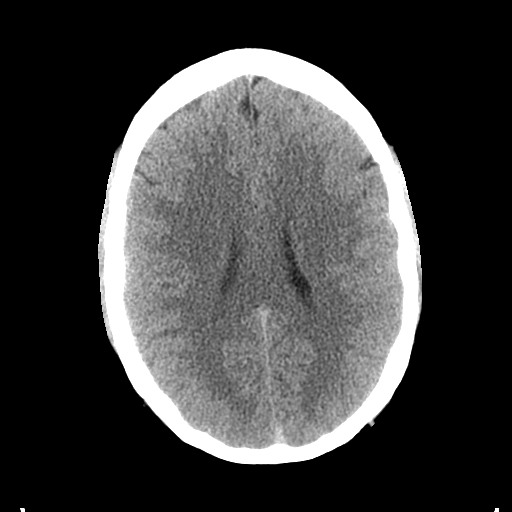
[im 18/34  bone]
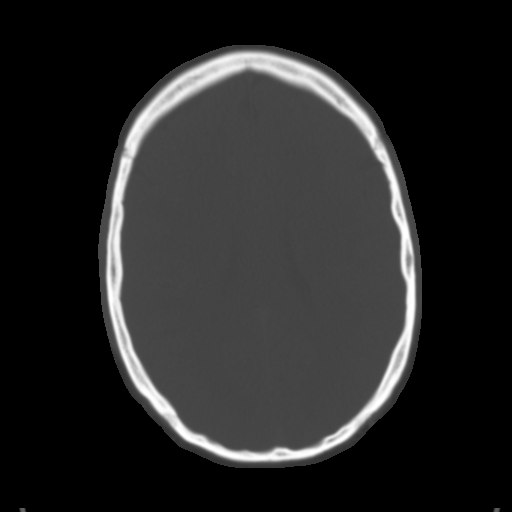
[im 21/34  brain]
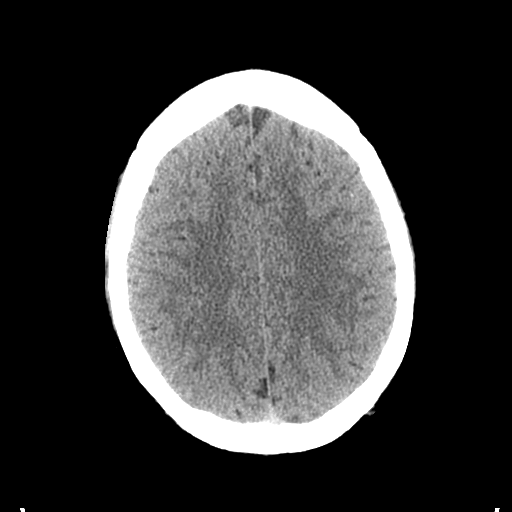
[im 24/34  brain]
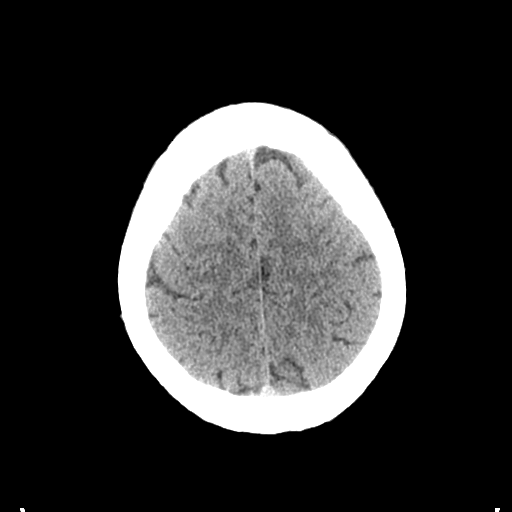
[im 28/34  brain]
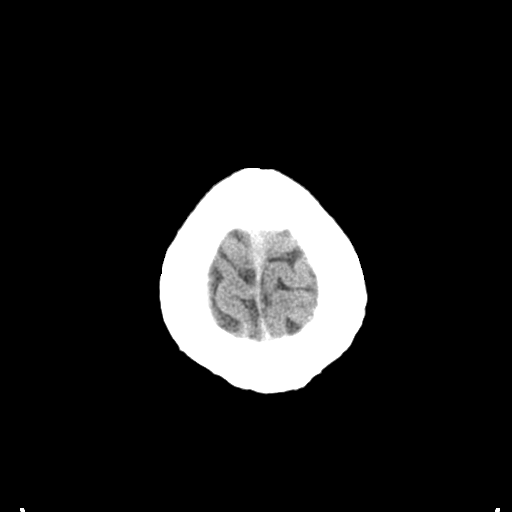
[im 31/34  brain]
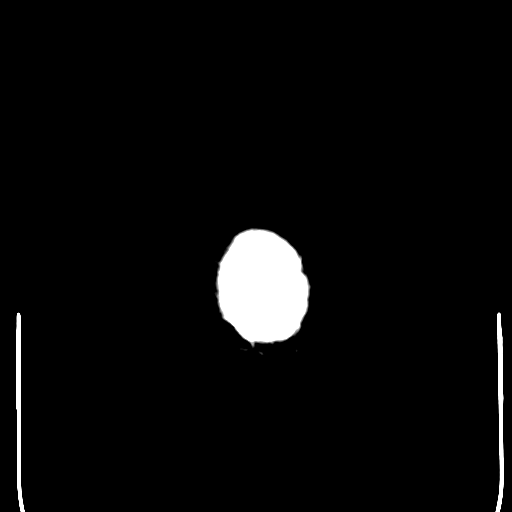
[im 31/34  bone]
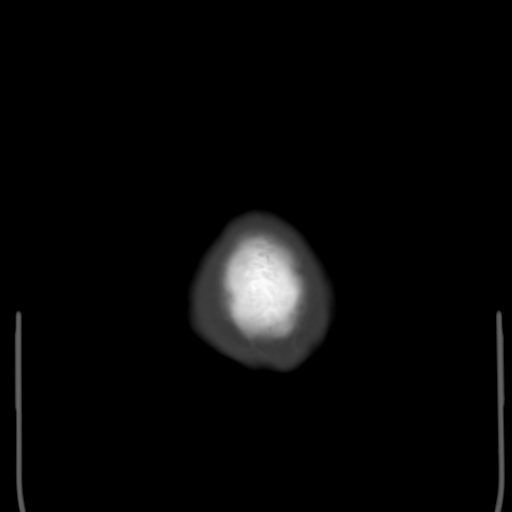

[Series 5: coronal soft tissue · coronal · 0.34mm/px · 3 of 80 slices shown]
[im 27/80  brain]
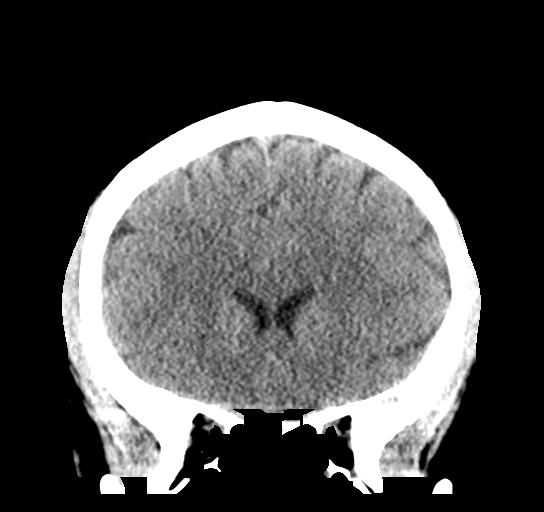
[im 36/80  brain]
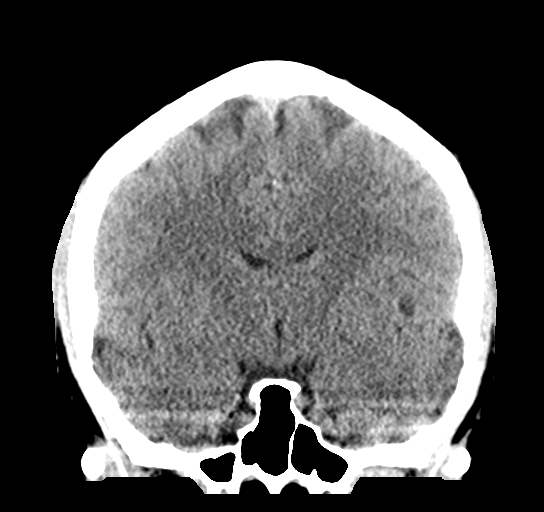
[im 44/80  brain]
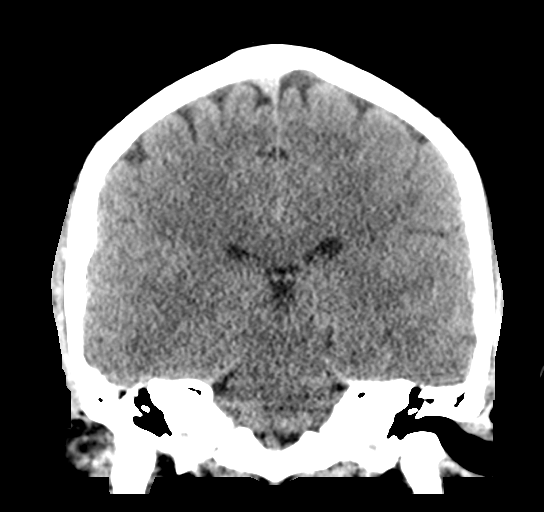

[Series 6: sagittal soft tissue · sagittal · 0.35mm/px · 3 of 60 slices shown]
[im 20/60  brain]
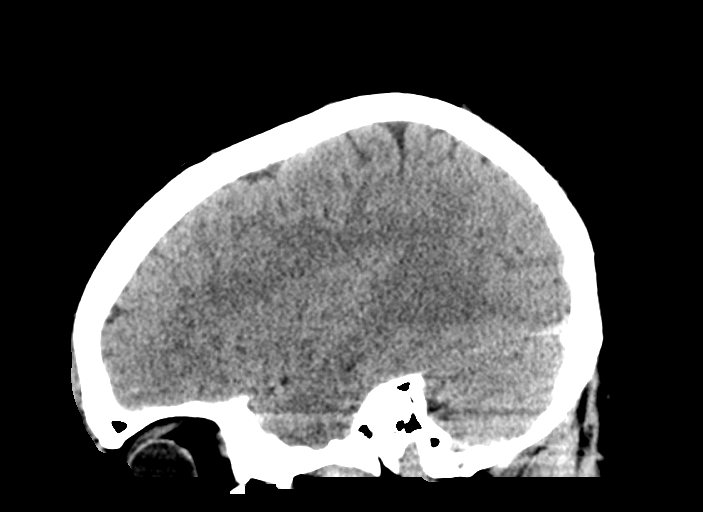
[im 30/60  brain]
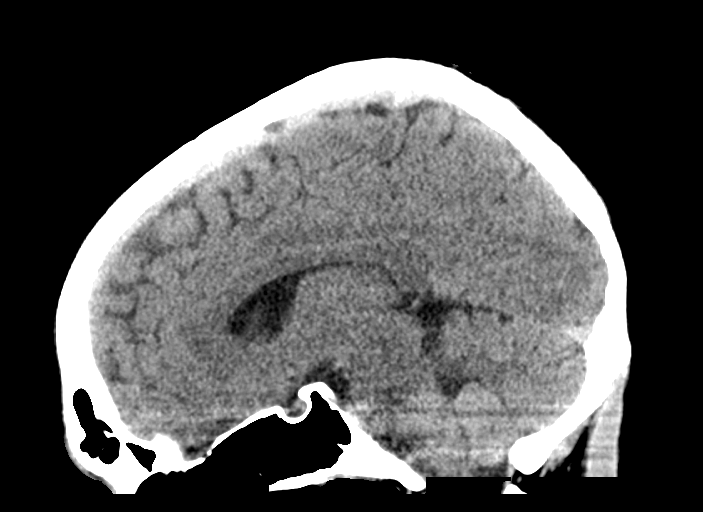
[im 40/60  brain]
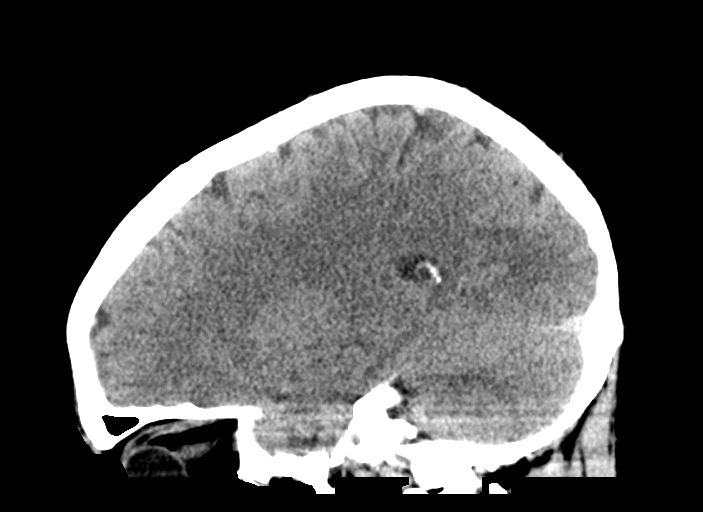

[15 of 47 positions shown; findings below may reference images not displayed]

FINDINGS: Brain: No evidence of acute infarction, hemorrhage, hydrocephalus,
extra-axial collection or mass lesion/mass effect.

Vascular: No hyperdense vessel or unexpected calcification.

Skull: There is right frontal scalp swelling without evidence for an
underlying fracture.

Sinuses/Orbits: No acute finding.

Other: None.
IMPRESSION: 1. No acute intracranial abnormality.
2. Right frontal scalp swelling without evidence for underlying
fracture.

## 2021-03-21 IMAGING — CR DG FINGER MIDDLE 2+V*L*
3 series · 3 of 3 positions shown · non-contrast
Comparison: None

CLINICAL DATA: Fall, evaluate for fracture, middle finger pain

EXAM:
LEFT MIDDLE FINGER 2+V

[x finger pa left]
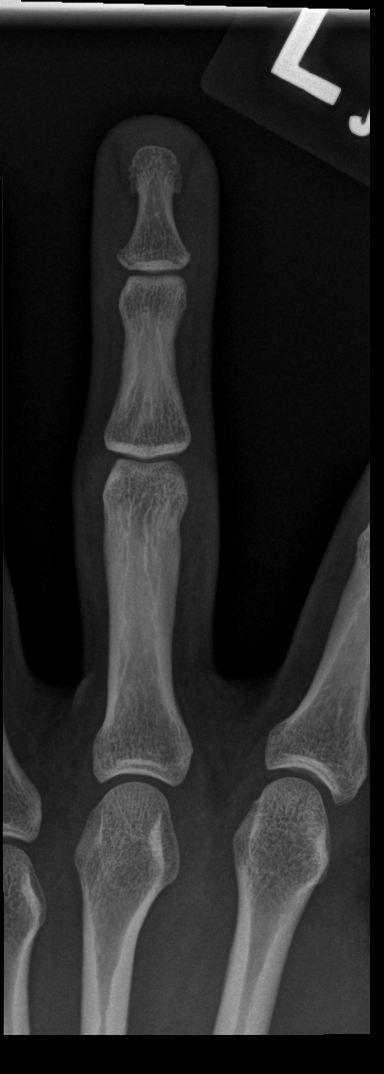

[x finger obl left]
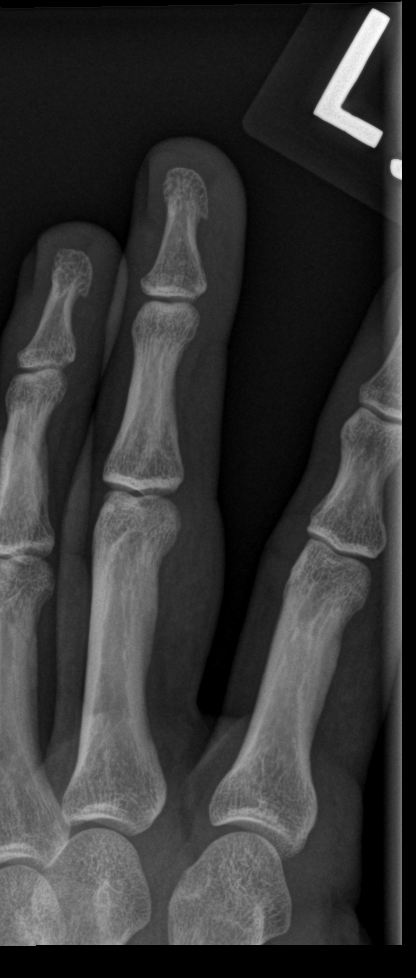

[x finger lat left]
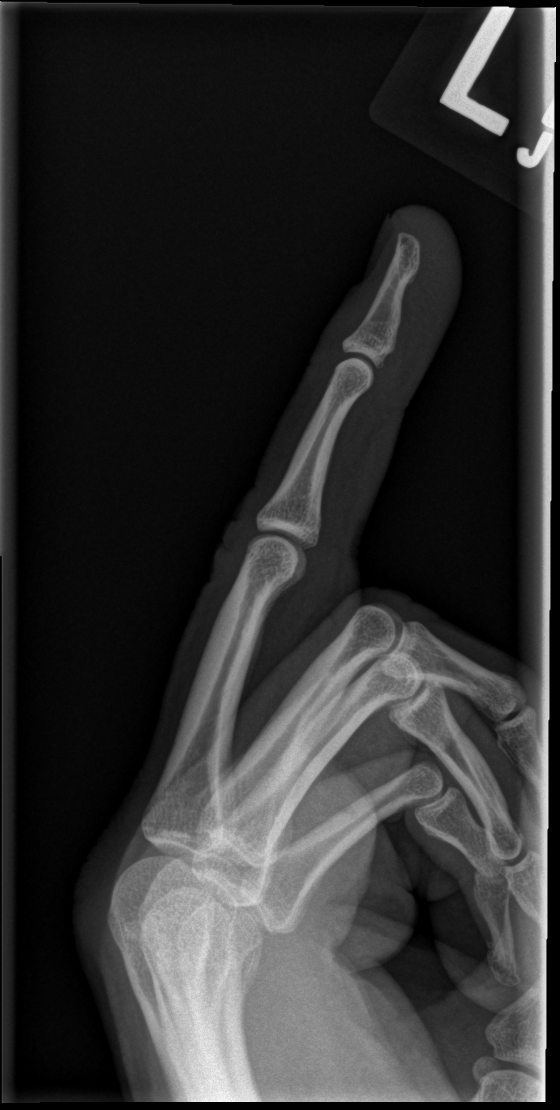

[3 of 3 positions shown; findings below may reference images not displayed]

FINDINGS: Tiny lucency mediolateral base of the third distal phalanx may
reflect a small nondisplaced fracture with some mild soft tissue
swelling of the third digit. Fracture line appears to extend into
the third distal interphalangeal joint. No other acute fracture or
traumatic malalignment is seen.
IMPRESSION: Tiny lucency mediolateral base of the third distal phalanx may
reflect a small nondisplaced fracture with intra-articular extension
and associated soft tissue swelling.

## 2022-01-24 ENCOUNTER — Encounter (HOSPITAL_BASED_OUTPATIENT_CLINIC_OR_DEPARTMENT_OTHER): Payer: Self-pay

## 2022-01-24 ENCOUNTER — Emergency Department (HOSPITAL_BASED_OUTPATIENT_CLINIC_OR_DEPARTMENT_OTHER): Payer: No Typology Code available for payment source

## 2022-01-24 ENCOUNTER — Emergency Department (HOSPITAL_BASED_OUTPATIENT_CLINIC_OR_DEPARTMENT_OTHER)
Admission: EM | Admit: 2022-01-24 | Discharge: 2022-01-24 | Disposition: A | Discharge status: Home / Self Care | Payer: No Typology Code available for payment source | Attending: Emergency Medicine | Admitting: Emergency Medicine

## 2022-01-24 ENCOUNTER — Other Ambulatory Visit: Payer: Self-pay

## 2022-01-24 DIAGNOSIS — M79641 Pain in right hand: Secondary | ICD-10-CM

## 2022-01-24 DIAGNOSIS — M79644 Pain in right finger(s): Secondary | ICD-10-CM | POA: Diagnosis present
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
## 2022-01-24 NOTE — Discharge Instructions (Signed)
X-rays are negative, he can take Tylenol and Motrin for pain.  Follow-up with your primary if you continue having symptoms. ?
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
## 2022-06-16 ENCOUNTER — Other Ambulatory Visit: Payer: Self-pay

## 2022-06-16 ENCOUNTER — Encounter: Payer: Self-pay | Admitting: Emergency Medicine

## 2022-06-16 ENCOUNTER — Ambulatory Visit
Admission: EM | Admit: 2022-06-16 | Discharge: 2022-06-16 | Disposition: A | Discharge status: Home / Self Care | Payer: 59 | Attending: Physician Assistant | Admitting: Physician Assistant

## 2022-06-16 DIAGNOSIS — L0100 Impetigo, unspecified: Secondary | ICD-10-CM | POA: Diagnosis not present

## 2022-06-16 DIAGNOSIS — L089 Local infection of the skin and subcutaneous tissue, unspecified: Secondary | ICD-10-CM | POA: Diagnosis not present

## 2022-06-16 MED ORDER — CEPHALEXIN 500 MG PO CAPS: 500.0000 mg | ORAL_CAPSULE | Freq: Three times a day (TID) | ORAL | 0 refills | Status: AC

## 2022-06-16 MED ORDER — MUPIROCIN 2 % EX OINT: 1.0000 | TOPICAL_OINTMENT | Freq: Two times a day (BID) | CUTANEOUS | 0 refills | Status: AC

## 2022-06-16 NOTE — ED Provider Notes (Signed)
[facility]    CSN: [id number] Arrival date & time: 06/16/22  1358      History   Chief Complaint Chief Complaint  Patient presents with   Rash    Foot rash - Entered by patient    HPI Julian Hall is a 36 y.o. male.   Patient presents today with a 1 week history of burning/pruritic rash on both feet but worse on the right.  Reports that symptoms began suddenly and have continued to spread over the past several days.  He has been exposed to staff as his stepdaughter was diagnosed with impetigo.  Denies any additional known sick contacts with rash symptoms.  He has tried over-the-counter medications including eczema cream as well as athlete's foot without improvement of symptoms.  He has also tried Epson salt soaks without improvement.  He denies history of diabetes or immunosuppression.  Denies recurrent skin infections or history of MRSA.  Denies any recent antibiotic use.  Denies any associated fever, nausea, vomiting, abdominal pain, fever.  Denies any additional lesions.  Denies any known exposure to new soaps, detergents, medications, insects, plants, animals.    Past Medical History:  Diagnosis Date   Anxiety    social anxiety   Depression    using Zoloft - for 9-10 yrs.     There are no problems to display for this patient.   Past Surgical History:  Procedure Laterality Date   HERNIA REPAIR Left 04/21/14   INGUINAL HERNIA REPAIR Left 04/21/2014   Procedure: LAPAROSCOPIC LEFT INGUINAL HERNIA REPAIR;  Surgeon: Ralene Ok, MD;  Location: [place];  Service: General;  Laterality: Left;   INSERTION OF MESH Left 04/21/2014   Procedure: INSERTION OF MESH;  Surgeon: Ralene Ok, MD;  Location: [place];  Service: General;  Laterality: Left;   TONSILLECTOMY     TONSILLECTOMY AND ADENOIDECTOMY     TYMPANOSTOMY TUBE PLACEMENT     WISDOM TOOTH EXTRACTION         Home Medications    Prior to Admission medications   Medication Sig Start Date End Date Taking?  Authorizing Provider  cephALEXin (KEFLEX) 500 MG capsule Take 1 capsule (500 mg total) by mouth 3 (three) times daily. 06/16/22  Yes Jymir Dunaj, Junie Panning K, PA-C  mupirocin ointment (BACTROBAN) 2 % Apply 1 Application topically 2 (two) times daily. 06/16/22  Yes [place], Derry Skill, PA-C  ALPRAZolam (XANAX) 0.5 MG tablet Take 0.5 mg by mouth at bedtime as needed for anxiety. Patient not taking: Reported on 06/16/2022    [provider]  atomoxetine (STRATTERA) 25 MG capsule Take 25 mg by mouth daily.  Patient not taking: Reported on 06/16/2022 12/17/19   [provider]  bacitracin ointment Apply 1 application topically 2 (two) times daily. Patient not taking: Reported on 06/16/2022 12/22/19   Varney Biles, MD  ibuprofen (ADVIL,MOTRIN) 200 MG tablet Take 200 mg by mouth every 6 (six) hours as needed.    [provider]  Melatonin 3 MG CAPS Take 1 capsule by mouth at bedtime.    [provider]  oxyCODONE-acetaminophen (PERCOCET) 7.5-325 MG per tablet Take 1 tablet by mouth every 4 (four) hours as needed for pain. Patient not taking: Reported on 12/22/2019 04/21/14   Ralene Ok, MD  sertraline (ZOLOFT) 100 MG tablet Take 100 mg by mouth at bedtime.     [provider]    Family History History reviewed. No pertinent family history.  Social History Social History   Tobacco Use  Smoking status: Never  Vaping Use   Vaping Use: Never used  Substance Use Topics   Alcohol use: Not Currently    Comment: occasionally   Drug use: No     Allergies   Patient has no known allergies.   Review of Systems Review of Systems  Constitutional:  Positive for activity change. Negative for appetite change, fatigue and fever.  Gastrointestinal:  Negative for abdominal pain, diarrhea, nausea and vomiting.  Musculoskeletal:  Negative for arthralgias and myalgias.  Skin:  Positive for color change, rash and wound.     Physical Exam Triage Vital Signs ED Triage Vitals   Enc Vitals Group     BP 06/16/22 1431 111/76     Pulse Rate 06/16/22 1431 70     Resp 06/16/22 1431 18     Temp 06/16/22 1431 97.9 F (36.6 C)     Temp Source 06/16/22 1431 Oral     SpO2 06/16/22 1431 96 %     Weight --      Height --      Head Circumference --      Peak Flow --      Pain Score 06/16/22 1428 0     Pain Loc --      Pain Edu? --      Excl. in [place]? --    No data found.  Updated Vital Signs BP 111/76 (BP Location: Left Arm)   Pulse 70   Temp 97.9 F (36.6 C) (Oral)   Resp 18   SpO2 96%   Visual Acuity Right Eye Distance:   Left Eye Distance:   Bilateral Distance:    Right Eye Near:   Left Eye Near:    Bilateral Near:     Physical Exam Vitals reviewed.  Constitutional:      General: He is awake.     Appearance: Normal appearance. He is well-developed. He is not ill-appearing.     Comments: Very pleasant male appears stated age in no acute distress sitting comfortably on exam room table  HENT:     Head: Normocephalic and atraumatic.     Mouth/Throat:     Pharynx: No oropharyngeal exudate, posterior oropharyngeal erythema or uvula swelling.  Cardiovascular:     Rate and Rhythm: Normal rate and regular rhythm.     Heart sounds: Normal heart sounds, S1 normal and S2 normal. No murmur heard. Pulmonary:     Effort: Pulmonary effort is normal.     Breath sounds: Normal breath sounds. No stridor. No wheezing, rhonchi or rales.     Comments: Clear to auscultation bilaterally Skin:    Findings: Rash present. Rash is crusting, macular and papular.     Comments: Maculopapular rash with associated crusting noted dorsal right foot at MTP junction and into second, third, fourth toe.  Scattered areas of maculopapular rash with crusting noted on left foot.  No streaking or evidence of lymphangitis.  Neurological:     Mental Status: He is alert.  Psychiatric:        Behavior: Behavior is cooperative.      UC Treatments / Results  Labs (all labs ordered are  listed, but only abnormal results are displayed) Labs Reviewed - No data to display  EKG   Radiology No results found.  Procedures Procedures (including critical care time)  Medications Ordered in UC Medications - No data to display  Initial Impression / Assessment and Plan / UC Course  I have reviewed the triage vital signs and  the nursing notes.  Pertinent labs & imaging results that were available during my care of the patient were reviewed by me and considered in my medical decision making (see chart for details).     Concern for impetigo given clinical presentation.  Will treat with Keflex as well as Bactroban.  He was encouraged to keep area clean and apply Bactroban ointment twice daily.  Discussed that he should wash and dry all of his footwear and high heat.  Also recommended that he either get new boots or allow them to air dry and use disinfectant to prevent reinfection.  Discussed that if he has any worsening symptoms he needs to be seen immediately including persistent or spread of rash.  If he develops any systemic symptoms including fever, nausea, vomiting he needs to go to the emergency room.  Strict return precautions given.  Work excuse note provided.  Final Clinical Impressions(s) / UC Diagnoses   Final diagnoses:  Impetigo  Skin infection     Discharge Instructions      I am concerned that you have impetigo as well given how the rash looks in your known exposure.  Start cephalexin 3 times daily for 1 week.  Keep this area clean with soap and water and apply Bactroban ointment twice daily.  Make sure to wash all of your socks and insoles on high heat.  Get new boots or at least disinfect the boots that you have.  If this is not improving quickly you should be reevaluated.  If anything worsens and you have rapid spread of rash, fever, nausea, vomiting, pain you need to go to the emergency room immediately.     ED Prescriptions     Medication Sig Dispense  Auth. Provider   cephALEXin (KEFLEX) 500 MG capsule Take 1 capsule (500 mg total) by mouth 3 (three) times daily. 21 capsule Kamari Bilek K, PA-C   mupirocin ointment (BACTROBAN) 2 % Apply 1 Application topically 2 (two) times daily. 22 g Kortlynn Poust K, PA-C      PDMP not reviewed this encounter.   Terrilee Croak, PA-C 06/16/22 1452

## 2022-06-16 NOTE — Discharge Instructions (Addendum)
I am concerned that you have impetigo as well given how the rash looks in your known exposure.  Start cephalexin 3 times daily for 1 week.  Keep this area clean with soap and water and apply Bactroban ointment twice daily.  Make sure to wash all of your socks and insoles on high heat.  Get new boots or at least disinfect the boots that you have.  If this is not improving quickly you should be reevaluated.  If anything worsens and you have rapid spread of rash, fever, nausea, vomiting, pain you need to go to the emergency room immediately.

## 2022-06-16 NOTE — ED Triage Notes (Signed)
Rash noticed one week ago.  Patient has a rash to both feet.  Patient reports step daughter had impetigo.  Rash is to the top of feet particularly toes.  Areas itches.  Has some raw , scabbed sores on feet.    Initially used otc athletes foot, then gold bond products, and then soaked in Epson salts

## 2023-01-12 ENCOUNTER — Ambulatory Visit
Admission: EM | Admit: 2023-01-12 | Discharge: 2023-01-12 | Disposition: A | Discharge status: Home / Self Care | Payer: 59 | Attending: Physician Assistant | Admitting: Physician Assistant

## 2023-01-12 ENCOUNTER — Ambulatory Visit (INDEPENDENT_AMBULATORY_CARE_PROVIDER_SITE_OTHER): Payer: 59

## 2023-01-12 DIAGNOSIS — S96912A Strain of unspecified muscle and tendon at ankle and foot level, left foot, initial encounter: Secondary | ICD-10-CM | POA: Diagnosis not present

## 2023-01-12 DIAGNOSIS — M25572 Pain in left ankle and joints of left foot: Secondary | ICD-10-CM

## 2023-01-12 MED ORDER — IBUPROFEN 600 MG PO TABS: 600.0000 mg | ORAL_TABLET | Freq: Four times a day (QID) | ORAL | 0 refills | Status: AC | PRN

## 2023-01-12 NOTE — Discharge Instructions (Signed)
Advised to use ice therapy, 10 minutes on 20 minutes off, 3-4 times throughout the evening to reduce pain and swelling. Advised take ibuprofen 600 mg every 6 hours with food to help reduce pain and discomfort. Advised to wear the lace up ankle support in order to give the ankle additional stability.  Advised follow-up PCP return to urgent care if symptoms fail to improve.

## 2023-01-12 NOTE — ED Provider Notes (Signed)
[facility]    CSN: [id number] Arrival date & time: 01/12/23  1922      History   Chief Complaint Chief Complaint  Patient presents with   Ankle Injury    HPI Julian Hall is a 37 y.o. male.   37 year old male presents with left ankle pain.  Patient indicates this afternoon several hours ago he was walking down a 2 foot embankment when his left ankle inverted.  He indicates that after the strain he started having left ankle swelling, pain and discomfort.  He indicates he is having pain when he tries to walk and the discomfort is causing him to limp.  He indicates he is using ice and taking Tylenol for the discomfort, he does not have any numbness, tingling, or weakness present.  He is concerned about a possible fracture desires to have an x-ray.   Ankle Injury    Past Medical History:  Diagnosis Date   Anxiety    social anxiety   Depression    using Zoloft - for 9-10 yrs.     There are no problems to display for this patient.   Past Surgical History:  Procedure Laterality Date   HERNIA REPAIR Left 04/21/14   INGUINAL HERNIA REPAIR Left 04/21/2014   Procedure: LAPAROSCOPIC LEFT INGUINAL HERNIA REPAIR;  Surgeon: Ralene Ok, MD;  Location: [place];  Service: General;  Laterality: Left;   INSERTION OF MESH Left 04/21/2014   Procedure: INSERTION OF MESH;  Surgeon: Ralene Ok, MD;  Location: [place];  Service: General;  Laterality: Left;   TONSILLECTOMY     TONSILLECTOMY AND ADENOIDECTOMY     TYMPANOSTOMY TUBE PLACEMENT     WISDOM TOOTH EXTRACTION         Home Medications    Prior to Admission medications   Medication Sig Start Date End Date Taking? Authorizing Provider  ibuprofen (ADVIL) 600 MG tablet Take 1 tablet (600 mg total) by mouth every 6 (six) hours as needed. 01/12/23  Yes Nyoka Lint, PA-C  ALPRAZolam Duanne Moron) 0.5 MG tablet Take 0.5 mg by mouth at bedtime as needed for anxiety. Patient not taking: Reported on 06/16/2022    [provider]  atomoxetine (STRATTERA) 25 MG capsule Take 25 mg by mouth daily.  Patient not taking: Reported on 06/16/2022 12/17/19   [provider]  bacitracin ointment Apply 1 application topically 2 (two) times daily. Patient not taking: Reported on 06/16/2022 12/22/19   Varney Biles, MD  cephALEXin (KEFLEX) 500 MG capsule Take 1 capsule (500 mg total) by mouth 3 (three) times daily. 06/16/22   Raspet, Derry Skill, PA-C  Melatonin 3 MG CAPS Take 1 capsule by mouth at bedtime.    [provider]  mupirocin ointment (BACTROBAN) 2 % Apply 1 Application topically 2 (two) times daily. 06/16/22   Raspet, Derry Skill, PA-C  oxyCODONE-acetaminophen (PERCOCET) 7.5-325 MG per tablet Take 1 tablet by mouth every 4 (four) hours as needed for pain. Patient not taking: Reported on 12/22/2019 04/21/14   Ralene Ok, MD  sertraline (ZOLOFT) 100 MG tablet Take 125 mg by mouth at bedtime.    [provider]    Family History History reviewed. No pertinent family history.  Social History Social History   Tobacco Use   Smoking status: Never  Vaping Use   Vaping Use: Never used  Substance Use Topics   Alcohol use: Not Currently    Comment: occasionally   Drug use: No     Allergies  Patient has no known allergies.   Review of Systems Review of Systems  Musculoskeletal:  Positive for joint swelling (left ankle).     Physical Exam Triage Vital Signs ED Triage Vitals  Enc Vitals Group     BP 01/12/23 1935 126/83     Pulse Rate 01/12/23 1935 89     Resp 01/12/23 1935 18     Temp 01/12/23 1935 98.2 F (36.8 C)     Temp Source 01/12/23 1935 Oral     SpO2 01/12/23 1935 98 %     Weight --      Height --      Head Circumference --      Peak Flow --      Pain Score 01/12/23 1934 6     Pain Loc --      Pain Edu? --      Excl. in [place]? --    No data found.  Updated Vital Signs BP 126/83   Pulse 89   Temp 98.2 F (36.8 C) (Oral)   Resp 18   SpO2 98%   Visual  Acuity Right Eye Distance:   Left Eye Distance:   Bilateral Distance:    Right Eye Near:   Left Eye Near:    Bilateral Near:     Physical Exam Constitutional:      Appearance: Normal appearance.  Musculoskeletal:       Feet:  Feet:     Comments: Left ankle: 1+ swelling lateral malleolus measuring a 4 x 3 cm area.  Pain is present on palpation, range of motion is normal with mild pain, flexion extension is normal, stability is intact, minimal pain with varus valgus stressing. Neurological:     Mental Status: He is alert.      UC Treatments / Results  Labs (all labs ordered are listed, but only abnormal results are displayed) Labs Reviewed - No data to display  EKG   Radiology No results found.  Procedures Procedures (including critical care time)  Medications Ordered in UC Medications - No data to display  Initial Impression / Assessment and Plan / UC Course  I have reviewed the triage vital signs and the nursing notes.  Pertinent labs & imaging results that were available during my care of the patient were reviewed by me and considered in my medical decision making (see chart for details).    Plan: The diagnosis will be treated with the following: 1.  Acute left ankle pain: A.  Ibuprofen 600 mg every 6 hours with food to help reduce pain and discomfort. 2.  Strain left ankle: A.  Advised ice therapy, 10 minutes on 20 minutes off, 3-4 times throughout the day to help reduce pain and swelling. B.  Lace up ankle support to help give additional stability to the ankle. 3.  Advised to follow-up with orthopedics if symptoms fail to improve within the next 10 to 14 days. Final Clinical Impressions(s) / UC Diagnoses   Final diagnoses:  Acute left ankle pain  Strain of left ankle, initial encounter     Discharge Instructions      Advised to use ice therapy, 10 minutes on 20 minutes off, 3-4 times throughout the evening to reduce pain and swelling. Advised take  ibuprofen 600 mg every 6 hours with food to help reduce pain and discomfort. Advised to wear the lace up ankle support in order to give the ankle additional stability.  Advised follow-up PCP return to urgent care if symptoms fail  to improve.    ED Prescriptions     Medication Sig Dispense Auth. Provider   ibuprofen (ADVIL) 600 MG tablet Take 1 tablet (600 mg total) by mouth every 6 (six) hours as needed. 30 tablet Nyoka Lint, PA-C      PDMP not reviewed this encounter.   Nyoka Lint, PA-C 01/12/23 2003

## 2023-01-12 NOTE — ED Triage Notes (Addendum)
Pt presents to uc with co of left ankle pain since 4 pm when he was at work as a Engineer, structural and jumped down an embankment and thinks he at minimum sprained ankle. Pt reports pain is 6/10. Pt has iced it and taken tylenol otc
